# Patient Record
Sex: Female | Born: 1995 | Hispanic: Yes | Marital: Single | Smoking: Former smoker
Health system: Southern US, Community
[De-identification: ages and names within clinical notes are randomized; demographics above are authoritative.]

## PROBLEM LIST (undated history)

## (undated) DIAGNOSIS — Z789 Other specified health status: Secondary | ICD-10-CM

## (undated) DIAGNOSIS — Z3490 Encounter for supervision of normal pregnancy, unspecified, unspecified trimester: Secondary | ICD-10-CM

## (undated) HISTORY — PX: NO PAST SURGERIES: SHX2092

---
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
## 2017-03-24 ENCOUNTER — Encounter (HOSPITAL_COMMUNITY): Payer: Self-pay

## 2017-04-07 ENCOUNTER — Encounter: Payer: Self-pay | Admitting: Obstetrics and Gynecology

## 2017-04-07 ENCOUNTER — Ambulatory Visit (INDEPENDENT_AMBULATORY_CARE_PROVIDER_SITE_OTHER): Payer: Medicaid Other | Admitting: Obstetrics and Gynecology

## 2017-04-07 ENCOUNTER — Other Ambulatory Visit (HOSPITAL_COMMUNITY)
Admission: RE | Admit: 2017-04-07 | Discharge: 2017-04-07 | Disposition: A | Discharge status: Home / Self Care | Payer: Medicaid Other | Source: Ambulatory Visit | Attending: Obstetrics and Gynecology | Admitting: Obstetrics and Gynecology

## 2017-04-07 VITALS — BP 100/66 | HR 89 | Ht 63.0 in | Wt 132.3 lb

## 2017-04-07 DIAGNOSIS — N898 Other specified noninflammatory disorders of vagina: Secondary | ICD-10-CM | POA: Diagnosis present

## 2017-04-07 DIAGNOSIS — Z975 Presence of (intrauterine) contraceptive device: Secondary | ICD-10-CM | POA: Insufficient documentation

## 2017-04-07 NOTE — Progress Notes (Signed)
[place] and had a vaginal delivery on 12-11-16. Pt is following up and complains of vaginal discharge and odor.

## 2017-04-07 NOTE — Progress Notes (Signed)
Patient ID: Tonya Roth, female   DOB: Oct 11, 1996, 21 y.o.   MRN: [id number] Pt presents with c/o vaginal discharge, clear with slight odor x 1 week.  [place] [facility] [place]. [facility]. Sexual active same partner. Breast feeding  PE AF VSS Lungs clear Heart RRR Abd soft + BS GU Nl EGBUS, scant white discharge uterus small mobile non tender, no adnexal masses or tenderness  Wet prep obtained  A/P Vaginal discharge  Wet prep obtained. Will treat as per test results. F/U PRN

## 2017-04-08 LAB — CERVICOVAGINAL ANCILLARY ONLY
Bacterial vaginitis: POSITIVE — AB
CHLAMYDIA, DNA PROBE: NEGATIVE
Candida vaginitis: NEGATIVE
Neisseria Gonorrhea: NEGATIVE
Trichomonas: POSITIVE — AB

## 2017-04-13 ENCOUNTER — Other Ambulatory Visit: Payer: Self-pay | Admitting: *Deleted

## 2017-04-13 DIAGNOSIS — N760 Acute vaginitis: Principal | ICD-10-CM

## 2017-04-13 DIAGNOSIS — A599 Trichomoniasis, unspecified: Secondary | ICD-10-CM

## 2017-04-13 DIAGNOSIS — B9689 Other specified bacterial agents as the cause of diseases classified elsewhere: Secondary | ICD-10-CM

## 2017-04-13 MED ORDER — METRONIDAZOLE 500 MG PO TABS: 500.0000 mg | ORAL_TABLET | Freq: Two times a day (BID) | ORAL | 0 refills | Status: AC

## 2017-04-13 NOTE — Progress Notes (Signed)
Orders from Dr Alysia PennaErvin for Flagyl sent. See lab note.

## 2017-04-20 ENCOUNTER — Ambulatory Visit: Payer: Medicaid Other | Admitting: Certified Nurse Midwife

## 2017-04-22 ENCOUNTER — Ambulatory Visit: Payer: Medicaid Other | Admitting: Obstetrics & Gynecology

## 2017-04-27 ENCOUNTER — Ambulatory Visit: Payer: Medicaid Other | Admitting: Obstetrics and Gynecology

## 2017-05-10 ENCOUNTER — Ambulatory Visit (INDEPENDENT_AMBULATORY_CARE_PROVIDER_SITE_OTHER): Payer: Medicaid Other | Admitting: Obstetrics

## 2017-05-10 ENCOUNTER — Encounter: Payer: Self-pay | Admitting: Obstetrics

## 2017-05-10 VITALS — BP 116/61 | HR 89 | Wt 130.6 lb

## 2017-05-10 DIAGNOSIS — B9689 Other specified bacterial agents as the cause of diseases classified elsewhere: Secondary | ICD-10-CM

## 2017-05-10 DIAGNOSIS — N760 Acute vaginitis: Secondary | ICD-10-CM | POA: Diagnosis not present

## 2017-05-10 DIAGNOSIS — N898 Other specified noninflammatory disorders of vagina: Secondary | ICD-10-CM | POA: Diagnosis not present

## 2017-05-10 DIAGNOSIS — A5901 Trichomonal vulvovaginitis: Secondary | ICD-10-CM

## 2017-05-10 DIAGNOSIS — B3731 Acute candidiasis of vulva and vagina: Secondary | ICD-10-CM

## 2017-05-10 DIAGNOSIS — B373 Candidiasis of vulva and vagina: Secondary | ICD-10-CM | POA: Diagnosis not present

## 2017-05-10 MED ORDER — FLUCONAZOLE 150 MG PO TABS: 150.0000 mg | ORAL_TABLET | Freq: Once | ORAL | 2 refills | Status: AC

## 2017-05-10 MED ORDER — METRONIDAZOLE 500 MG PO TABS: 2000.0000 mg | ORAL_TABLET | Freq: Every day | ORAL | 2 refills | Status: AC

## 2017-05-10 NOTE — Progress Notes (Signed)
Patient complains about lump in breast- L. It was present for 2 weeks and finally went away.  Patient was given antibiotic- but her partner was not treated- so they need to discuss treatment.

## 2017-05-10 NOTE — Progress Notes (Signed)
Patient ID: Tonya Roth, female   DOB: [birth year], 21 y.o.   MRN: [id number]  Chief Complaint  Patient presents with  . Gynecologic Exam    HPI Tonya Roth is a 21 y.o. female.  Had bump on breast around nipple that went away.  She is breast feeding.  History of BV and Trichomonas, treated, but partner has not been treated, and she has continued sexual intercourse. HPI  No past medical history on file.  Past Surgical History:  Procedure Laterality Date  . NO PAST SURGERIES      Family History  Problem Relation Age of Onset  . Cancer Maternal Grandmother        breast  . Hypertension Neg Hx   . Stroke Neg Hx     Social History Social History  Substance Use Topics  . Smoking status: Former Smoker    Quit date: 05/04/2016  . Smokeless tobacco: Never Used  . Alcohol use No     Comment: none with pregnancy    No Known Allergies  Current Outpatient Prescriptions  Medication Sig Dispense Refill  . fluconazole (DIFLUCAN) 150 MG tablet Take 1 tablet (150 mg total) by mouth once. 1 tablet 2  . metroNIDAZOLE (FLAGYL) 500 MG tablet Take 1 tablet (500 mg total) by mouth 2 (two) times daily. (Patient not taking: Reported on 05/10/2017) 14 tablet 0  . metroNIDAZOLE (FLAGYL) 500 MG tablet Take 4 tablets (2,000 mg total) by mouth daily. 8 tablet 2   No current facility-administered medications for this visit.     Review of Systems Review of Systems Constitutional: negative for fatigue and weight loss Respiratory: negative for cough and wheezing Cardiovascular: negative for chest pain, fatigue and palpitations Gastrointestinal: negative for abdominal pain and change in bowel habits Genitourinary:negative Integument/breast: negative for nipple discharge Musculoskeletal:negative for myalgias Neurological: negative for gait problems and tremors Behavioral/Psych: negative for abusive relationship, depression Endocrine: negative for temperature intolerance      Blood pressure  116/61, pulse 89, weight 130 lb 9.6 oz (59.2 kg), currently breastfeeding.  Physical Exam Physical Exam:  Deferred   >50% of 10 min visit spent on counseling and coordination of care.    Data Reviewed Labs  Assessment     1. Vaginal discharge Rx: - metroNIDAZOLE (FLAGYL) 500 MG tablet; Take 4 tablets (2,000 mg total) by mouth daily.  Dispense: 8 tablet; Refill: 2 - fluconazole (DIFLUCAN) 150 MG tablet; Take 1 tablet (150 mg total) by mouth once.  Dispense: 1 tablet; Refill: 2  2. Trichomonas vaginitis Rx: - metroNIDAZOLE (FLAGYL) 500 MG tablet; Take 4 tablets (2,000 mg total) by mouth daily.  Dispense: 8 tablet; Refill: 2  3. BV (bacterial vaginosis) Rx: - metroNIDAZOLE (FLAGYL) 500 MG tablet; Take 4 tablets (2,000 mg total) by mouth daily.  Dispense: 8 tablet; Refill: 2  4. Candida vaginitis Rx: - fluconazole (DIFLUCAN) 150 MG tablet; Take 1 tablet (150 mg total) by mouth once.  Dispense: 1 tablet; Refill: 2    Plan    Follow up prn  No orders of the defined types were placed in this encounter.  Meds ordered this encounter  Medications  . metroNIDAZOLE (FLAGYL) 500 MG tablet    Sig: Take 4 tablets (2,000 mg total) by mouth daily.    Dispense:  8 tablet    Refill:  2  . fluconazole (DIFLUCAN) 150 MG tablet    Sig: Take 1 tablet (150 mg total) by mouth once.    Dispense:  1 tablet  Refill:  2
